# Patient Record
Sex: Male | Born: 1960 | Race: White | Hispanic: No | Marital: Married | Smoking: Never smoker
Health system: Southern US, Community
[De-identification: ages and names within clinical notes are randomized; demographics above are authoritative.]

## PROBLEM LIST (undated history)

## (undated) DIAGNOSIS — I10 Essential (primary) hypertension: Secondary | ICD-10-CM

## (undated) HISTORY — PX: KNEE ARTHROSCOPY: SUR90

---

## 1990-06-03 HISTORY — PX: OTHER SURGICAL HISTORY: SHX169

## 2006-03-07 ENCOUNTER — Ambulatory Visit: Payer: Self-pay | Admitting: Psychology

## 2006-03-10 ENCOUNTER — Ambulatory Visit: Payer: Self-pay | Admitting: Psychology

## 2006-03-12 ENCOUNTER — Ambulatory Visit: Payer: Self-pay | Admitting: Psychology

## 2006-03-28 ENCOUNTER — Ambulatory Visit: Payer: Self-pay | Admitting: Psychology

## 2006-04-08 ENCOUNTER — Ambulatory Visit: Payer: Self-pay | Admitting: Psychology

## 2006-06-19 ENCOUNTER — Ambulatory Visit: Payer: Self-pay | Admitting: Psychology

## 2006-07-08 ENCOUNTER — Ambulatory Visit: Payer: Self-pay | Admitting: Psychology

## 2006-07-29 ENCOUNTER — Ambulatory Visit: Payer: Self-pay | Admitting: Psychology

## 2006-08-05 ENCOUNTER — Ambulatory Visit: Payer: Self-pay | Admitting: Psychology

## 2006-08-19 ENCOUNTER — Ambulatory Visit: Payer: Self-pay | Admitting: Psychology

## 2006-09-30 ENCOUNTER — Ambulatory Visit: Payer: Self-pay | Admitting: Psychology

## 2010-01-03 ENCOUNTER — Ambulatory Visit: Payer: Self-pay | Admitting: Psychology

## 2010-03-28 ENCOUNTER — Ambulatory Visit: Payer: Self-pay | Admitting: Psychology

## 2010-04-12 ENCOUNTER — Ambulatory Visit: Payer: Self-pay | Admitting: Psychology

## 2010-10-15 ENCOUNTER — Ambulatory Visit (INDEPENDENT_AMBULATORY_CARE_PROVIDER_SITE_OTHER): Payer: BC Managed Care – PPO | Admitting: Psychology

## 2010-10-15 DIAGNOSIS — F4320 Adjustment disorder, unspecified: Secondary | ICD-10-CM

## 2010-10-24 ENCOUNTER — Ambulatory Visit (INDEPENDENT_AMBULATORY_CARE_PROVIDER_SITE_OTHER): Payer: BC Managed Care – PPO | Admitting: Psychology

## 2010-10-24 DIAGNOSIS — F4320 Adjustment disorder, unspecified: Secondary | ICD-10-CM

## 2010-11-07 ENCOUNTER — Ambulatory Visit (INDEPENDENT_AMBULATORY_CARE_PROVIDER_SITE_OTHER): Payer: BC Managed Care – PPO | Admitting: Psychology

## 2010-11-07 DIAGNOSIS — F4320 Adjustment disorder, unspecified: Secondary | ICD-10-CM

## 2010-11-08 ENCOUNTER — Ambulatory Visit: Payer: BC Managed Care – PPO | Admitting: Psychology

## 2010-11-22 ENCOUNTER — Ambulatory Visit (INDEPENDENT_AMBULATORY_CARE_PROVIDER_SITE_OTHER): Payer: BC Managed Care – PPO | Admitting: Psychology

## 2010-11-22 DIAGNOSIS — F4320 Adjustment disorder, unspecified: Secondary | ICD-10-CM

## 2010-12-18 ENCOUNTER — Ambulatory Visit (INDEPENDENT_AMBULATORY_CARE_PROVIDER_SITE_OTHER): Payer: BC Managed Care – PPO | Admitting: Psychology

## 2010-12-18 DIAGNOSIS — F4320 Adjustment disorder, unspecified: Secondary | ICD-10-CM

## 2011-02-11 ENCOUNTER — Ambulatory Visit: Payer: BC Managed Care – PPO | Admitting: Psychology

## 2011-02-21 ENCOUNTER — Ambulatory Visit (INDEPENDENT_AMBULATORY_CARE_PROVIDER_SITE_OTHER): Payer: BC Managed Care – PPO | Admitting: Licensed Clinical Social Worker

## 2011-02-21 DIAGNOSIS — F4320 Adjustment disorder, unspecified: Secondary | ICD-10-CM

## 2011-03-05 ENCOUNTER — Ambulatory Visit: Payer: BC Managed Care – PPO | Admitting: Psychology

## 2011-03-13 ENCOUNTER — Ambulatory Visit (INDEPENDENT_AMBULATORY_CARE_PROVIDER_SITE_OTHER): Payer: BC Managed Care – PPO | Admitting: Licensed Clinical Social Worker

## 2011-03-13 DIAGNOSIS — F4320 Adjustment disorder, unspecified: Secondary | ICD-10-CM

## 2011-03-28 ENCOUNTER — Ambulatory Visit (INDEPENDENT_AMBULATORY_CARE_PROVIDER_SITE_OTHER): Payer: BC Managed Care – PPO | Admitting: Licensed Clinical Social Worker

## 2011-03-28 DIAGNOSIS — F4320 Adjustment disorder, unspecified: Secondary | ICD-10-CM

## 2011-04-05 ENCOUNTER — Ambulatory Visit (INDEPENDENT_AMBULATORY_CARE_PROVIDER_SITE_OTHER): Payer: BC Managed Care – PPO | Admitting: Psychology

## 2011-04-05 DIAGNOSIS — F4320 Adjustment disorder, unspecified: Secondary | ICD-10-CM

## 2011-04-09 ENCOUNTER — Ambulatory Visit (INDEPENDENT_AMBULATORY_CARE_PROVIDER_SITE_OTHER): Payer: BC Managed Care – PPO | Admitting: Licensed Clinical Social Worker

## 2011-04-09 DIAGNOSIS — F4320 Adjustment disorder, unspecified: Secondary | ICD-10-CM

## 2011-04-19 ENCOUNTER — Ambulatory Visit (INDEPENDENT_AMBULATORY_CARE_PROVIDER_SITE_OTHER): Payer: BC Managed Care – PPO | Admitting: Licensed Clinical Social Worker

## 2011-04-19 DIAGNOSIS — F4320 Adjustment disorder, unspecified: Secondary | ICD-10-CM

## 2011-04-24 ENCOUNTER — Ambulatory Visit (INDEPENDENT_AMBULATORY_CARE_PROVIDER_SITE_OTHER): Payer: BC Managed Care – PPO | Admitting: Licensed Clinical Social Worker

## 2011-04-24 DIAGNOSIS — F4323 Adjustment disorder with mixed anxiety and depressed mood: Secondary | ICD-10-CM

## 2011-05-03 ENCOUNTER — Ambulatory Visit (INDEPENDENT_AMBULATORY_CARE_PROVIDER_SITE_OTHER): Payer: BC Managed Care – PPO | Admitting: Licensed Clinical Social Worker

## 2011-05-03 DIAGNOSIS — F4320 Adjustment disorder, unspecified: Secondary | ICD-10-CM

## 2011-05-10 ENCOUNTER — Ambulatory Visit (INDEPENDENT_AMBULATORY_CARE_PROVIDER_SITE_OTHER): Payer: BC Managed Care – PPO | Admitting: Licensed Clinical Social Worker

## 2011-05-10 DIAGNOSIS — F4320 Adjustment disorder, unspecified: Secondary | ICD-10-CM

## 2011-05-20 ENCOUNTER — Ambulatory Visit: Payer: BC Managed Care – PPO | Admitting: Licensed Clinical Social Worker

## 2011-05-21 ENCOUNTER — Ambulatory Visit (INDEPENDENT_AMBULATORY_CARE_PROVIDER_SITE_OTHER): Payer: BC Managed Care – PPO | Admitting: Licensed Clinical Social Worker

## 2011-05-21 DIAGNOSIS — F4320 Adjustment disorder, unspecified: Secondary | ICD-10-CM

## 2011-06-06 ENCOUNTER — Ambulatory Visit (INDEPENDENT_AMBULATORY_CARE_PROVIDER_SITE_OTHER): Payer: BC Managed Care – PPO | Admitting: Licensed Clinical Social Worker

## 2011-06-06 DIAGNOSIS — F4323 Adjustment disorder with mixed anxiety and depressed mood: Secondary | ICD-10-CM

## 2011-06-14 ENCOUNTER — Ambulatory Visit (INDEPENDENT_AMBULATORY_CARE_PROVIDER_SITE_OTHER): Payer: BC Managed Care – PPO | Admitting: Licensed Clinical Social Worker

## 2011-06-14 DIAGNOSIS — F4320 Adjustment disorder, unspecified: Secondary | ICD-10-CM

## 2011-06-21 ENCOUNTER — Ambulatory Visit: Payer: BC Managed Care – PPO | Admitting: Licensed Clinical Social Worker

## 2011-06-27 ENCOUNTER — Ambulatory Visit: Payer: BC Managed Care – PPO | Admitting: Licensed Clinical Social Worker

## 2011-07-02 ENCOUNTER — Ambulatory Visit (INDEPENDENT_AMBULATORY_CARE_PROVIDER_SITE_OTHER): Payer: BC Managed Care – PPO | Admitting: Licensed Clinical Social Worker

## 2011-07-02 DIAGNOSIS — F4320 Adjustment disorder, unspecified: Secondary | ICD-10-CM

## 2011-07-11 ENCOUNTER — Ambulatory Visit: Payer: BC Managed Care – PPO | Admitting: Licensed Clinical Social Worker

## 2011-07-25 ENCOUNTER — Ambulatory Visit (INDEPENDENT_AMBULATORY_CARE_PROVIDER_SITE_OTHER): Payer: BC Managed Care – PPO | Admitting: Licensed Clinical Social Worker

## 2011-07-25 DIAGNOSIS — F4320 Adjustment disorder, unspecified: Secondary | ICD-10-CM

## 2012-05-14 ENCOUNTER — Other Ambulatory Visit: Payer: Self-pay | Admitting: Orthopedic Surgery

## 2012-05-14 DIAGNOSIS — M25562 Pain in left knee: Secondary | ICD-10-CM

## 2012-05-18 ENCOUNTER — Ambulatory Visit
Admission: RE | Admit: 2012-05-18 | Discharge: 2012-05-18 | Disposition: A | Discharge status: Home / Self Care | Payer: BC Managed Care – PPO | Source: Ambulatory Visit | Attending: Orthopedic Surgery | Admitting: Orthopedic Surgery

## 2012-05-18 DIAGNOSIS — M25562 Pain in left knee: Secondary | ICD-10-CM

## 2014-04-13 ENCOUNTER — Other Ambulatory Visit: Payer: Self-pay | Admitting: Gastroenterology

## 2014-07-04 ENCOUNTER — Encounter (HOSPITAL_COMMUNITY): Payer: Self-pay | Admitting: *Deleted

## 2014-07-12 ENCOUNTER — Ambulatory Visit (HOSPITAL_COMMUNITY): Payer: BLUE CROSS/BLUE SHIELD | Admitting: Anesthesiology

## 2014-07-12 ENCOUNTER — Encounter (HOSPITAL_COMMUNITY): Payer: Self-pay | Admitting: Anesthesiology

## 2014-07-12 ENCOUNTER — Encounter (HOSPITAL_COMMUNITY)
Admission: RE | Disposition: A | Discharge status: Home / Self Care | Payer: Self-pay | Source: Ambulatory Visit | Attending: Gastroenterology

## 2014-07-12 ENCOUNTER — Ambulatory Visit (HOSPITAL_COMMUNITY)
Admission: RE | Admit: 2014-07-12 | Discharge: 2014-07-12 | Disposition: A | Discharge status: Home / Self Care | Payer: BLUE CROSS/BLUE SHIELD | Source: Ambulatory Visit | Attending: Gastroenterology | Admitting: Gastroenterology

## 2014-07-12 DIAGNOSIS — D120 Benign neoplasm of cecum: Secondary | ICD-10-CM | POA: Diagnosis not present

## 2014-07-12 DIAGNOSIS — Z1211 Encounter for screening for malignant neoplasm of colon: Secondary | ICD-10-CM | POA: Insufficient documentation

## 2014-07-12 DIAGNOSIS — I10 Essential (primary) hypertension: Secondary | ICD-10-CM | POA: Diagnosis not present

## 2014-07-12 HISTORY — DX: Essential (primary) hypertension: I10

## 2014-07-12 HISTORY — PX: COLONOSCOPY WITH PROPOFOL: SHX5780

## 2014-07-12 SURGERY — COLONOSCOPY WITH PROPOFOL
Anesthesia: Monitor Anesthesia Care

## 2014-07-12 MED ORDER — PROPOFOL 10 MG/ML IV BOLUS
INTRAVENOUS | Status: AC
  Filled 2014-07-12: qty 20

## 2014-07-12 MED ORDER — SODIUM CHLORIDE 0.9 % IV SOLN: INTRAVENOUS | Status: DC

## 2014-07-12 MED ORDER — LACTATED RINGERS IV SOLN
INTRAVENOUS | Status: DC | PRN
  Administered 2014-07-12: 10:00:00 via INTRAVENOUS

## 2014-07-12 MED ORDER — PROPOFOL 10 MG/ML IV BOLUS
INTRAVENOUS | Status: DC | PRN
  Administered 2014-07-12 (×3): 100 mg via INTRAVENOUS
  Administered 2014-07-12 (×2): 50 mg via INTRAVENOUS

## 2014-07-12 SURGICAL SUPPLY — 21 items

## 2014-07-12 NOTE — Anesthesia Postprocedure Evaluation (Signed)
  Anesthesia Post-op Note  Patient: Casey Davis  Procedure(s) Performed: Procedure(s) (LRB): COLONOSCOPY WITH PROPOFOL (N/A)  Patient Location: PACU  Anesthesia Type: MAC  Level of Consciousness: awake and alert   Airway and Oxygen Therapy: Patient Spontanous Breathing  Post-op Pain: mild  Post-op Assessment: Post-op Vital signs reviewed, Patient's Cardiovascular Status Stable, Respiratory Function Stable, Patent Airway and No signs of Nausea or vomiting  Last Vitals:  Filed Vitals:   07/12/14 1040  BP: 177/97  Pulse: 62  Temp:   Resp: 14    Post-op Vital Signs: stable   Complications: No apparent anesthesia complications

## 2014-07-12 NOTE — Op Note (Signed)
Procedure: Baseline screening colonoscopy  Endoscopist: Earle Gell  Premedication: Propofol administered by anesthesia  Procedure: The patient was placed in the left lateral decubitus position. Anal inspection and digital rectal exam were normal. The Pentax pediatric colonoscope was introduced into the rectum and advanced to the cecum. A normal-appearing appendiceal orifice and ileocecal valve were identified. Colonic preparation for the exam today was good. Withdrawal time was 11 minutes  Rectum. Normal. Retroflexed view of the distal rectum normal  Sigmoid colon and descending colon. Normal  Splenic flexure. Normal  Transverse colon. Normal  Hepatic flexure. Normal  Ascending colon. Normal  Cecum and ileocecal valve. From the distal cecum, a 3 mm sessile polyp was removed with the cold biopsy forceps  Assessment: A 3 mm sized polyp was removed from the distal cecum; otherwise normal colonoscopy  Recommendation: If the cecal polyp returns adenomatous pathologically, the patient should undergo a surveillance colonoscopy in 5 years. If the polyp returns nonneoplastic pathologically, he should undergo a repeat screening colonoscopy in 10 years

## 2014-07-12 NOTE — H&P (Signed)
  Procedure: Baseline screening colonoscopy  History: The patient is a 54 year old male born Apr 21, 1961. He is scheduled to undergo his first screening colonoscopy with polypectomy to prevent colon cancer.  Medication allergies: Penicillin  Past medical history: Lumbar laminectomy. Right knee arthroscopy. Hypertension.  Exam: The patient is alert and lying comfortably on the endoscopy stretcher. Abdomen is soft and nontender to palpation. Lungs are clear to auscultation. Cardiac exam reveals a regular rhythm.  Plan: Proceed with baseline screening colonoscopy

## 2014-07-12 NOTE — Anesthesia Preprocedure Evaluation (Signed)
Anesthesia Evaluation  Patient identified by MRN, date of birth, ID band Patient awake    Reviewed: Allergy & Precautions, NPO status , Patient's Chart, lab work & pertinent test results  Airway Mallampati: II  TM Distance: >3 FB Neck ROM: Full    Dental no notable dental hx.    Pulmonary neg pulmonary ROS,  breath sounds clear to auscultation  Pulmonary exam normal       Cardiovascular Exercise Tolerance: Good hypertension, Pt. on medications Rhythm:Regular Rate:Normal     Neuro/Psych negative neurological ROS  negative psych ROS   GI/Hepatic negative GI ROS, Neg liver ROS,   Endo/Other  negative endocrine ROS  Renal/GU negative Renal ROS  negative genitourinary   Musculoskeletal negative musculoskeletal ROS (+)   Abdominal   Peds negative pediatric ROS (+)  Hematology negative hematology ROS (+)   Anesthesia Other Findings   Reproductive/Obstetrics negative OB ROS                             Anesthesia Physical Anesthesia Plan  ASA: II  Anesthesia Plan: MAC   Post-op Pain Management:    Induction: Intravenous  Airway Management Planned:   Additional Equipment:   Intra-op Plan:   Post-operative Plan:   Informed Consent: I have reviewed the patients History and Physical, chart, labs and discussed the procedure including the risks, benefits and alternatives for the proposed anesthesia with the patient or authorized representative who has indicated his/her understanding and acceptance.   Dental advisory given  Plan Discussed with: CRNA  Anesthesia Plan Comments:         Anesthesia Quick Evaluation

## 2014-07-12 NOTE — Transfer of Care (Signed)
Immediate Anesthesia Transfer of Care Note  Patient: Casey Davis  Procedure(s) Performed: Procedure(s) (LRB): COLONOSCOPY WITH PROPOFOL (N/A)  Patient Location: PACU  Anesthesia Type: MAC  Level of Consciousness: sedated, patient cooperative and responds to stimulation  Airway & Oxygen Therapy: Patient Spontanous Breathing and Patient connected to face mask oxgen  Post-op Assessment: Report given to PACU RN and Post -op Vital signs reviewed and stable  Post vital signs: Reviewed and stable  Complications: No apparent anesthesia complications

## 2014-07-12 NOTE — Discharge Instructions (Signed)

## 2014-07-13 ENCOUNTER — Encounter (HOSPITAL_COMMUNITY): Payer: Self-pay | Admitting: Gastroenterology

## 2016-05-08 ENCOUNTER — Ambulatory Visit (INDEPENDENT_AMBULATORY_CARE_PROVIDER_SITE_OTHER): Payer: BLUE CROSS/BLUE SHIELD | Admitting: Orthopedic Surgery

## 2016-05-08 ENCOUNTER — Encounter (INDEPENDENT_AMBULATORY_CARE_PROVIDER_SITE_OTHER): Payer: Self-pay | Admitting: Orthopedic Surgery

## 2016-05-08 ENCOUNTER — Ambulatory Visit (INDEPENDENT_AMBULATORY_CARE_PROVIDER_SITE_OTHER): Payer: BLUE CROSS/BLUE SHIELD

## 2016-05-08 DIAGNOSIS — M25561 Pain in right knee: Principal | ICD-10-CM

## 2016-05-08 DIAGNOSIS — G8929 Other chronic pain: Secondary | ICD-10-CM

## 2016-05-09 DIAGNOSIS — M25561 Pain in right knee: Secondary | ICD-10-CM

## 2016-05-09 DIAGNOSIS — G8929 Other chronic pain: Secondary | ICD-10-CM

## 2016-05-09 MED ORDER — BUPIVACAINE HCL 0.25 % IJ SOLN
4.0000 mL | INTRAMUSCULAR | Status: AC | PRN
  Administered 2016-05-09: 4 mL via INTRA_ARTICULAR

## 2016-05-09 MED ORDER — METHYLPREDNISOLONE ACETATE 40 MG/ML IJ SUSP
40.0000 mg | INTRAMUSCULAR | Status: AC | PRN
  Administered 2016-05-09: 40 mg via INTRA_ARTICULAR

## 2016-05-09 MED ORDER — LIDOCAINE HCL 1 % IJ SOLN
5.0000 mL | INTRAMUSCULAR | Status: AC | PRN
  Administered 2016-05-09: 5 mL

## 2016-05-09 NOTE — Progress Notes (Signed)
Office Visit Note   Patient: Casey Davis           Date of Birth: 31-Jul-1960           MRN: [id number] Visit Date: 05/08/2016 Requested by: Marius Ditch, MD [phone] N. [address] [place], [place] [address] PCP: Carlena Sax, MD  Subjective: Chief Complaint  Patient presents with  . Right Knee - Pain    HPI Casey Davis is a 55 year old patient with four-week history of right knee pain.  Had surgery 02/22/2013 which was for loose body removal and partial medial lateral meniscectomies.  He wears his brace some days.  Today is a better day for him than most.  He states that it feels like it's unstable at times with hyperextension.  Sitting with his knee bent increases the pain.  Since of last seen him he has had inflammatory pain which is affecting all joints of his body resolve.  He saw a rheumatologist 2 without much explanation of his symptoms.  His symptoms did resolve on their own.  It is hard for him to stand.  He denies any swelling or mechanical symptoms.              Review of Systems All systems reviewed are negative as they relate to the chief complaint within the history of present illness.  Patient denies  fevers or chills.    Assessment & Plan: Visit Diagnoses:  1. Chronic pain of right knee     Plan:Impression is right knee pain with likely progression of osteoarthritis.  Radiographs today do show what appears to be loss of joint space laterally.  Aspiration injection performed today.  I will pre-approve him for Euflexxa.  Also we'll start him in some physical therapy.  He'll come back in for Euflexxa injections once this cortisone shot wears off.  Follow-Up Instructions: No Follow-up on file.   Orders:  Orders Placed This Encounter  Procedures  . XR KNEE 3 VIEW RIGHT   No orders of the defined types were placed in this encounter.     Procedures: Large Joint Inj Date/Time: 05/09/2016 7:17 PM Performed by: Meredith Pel Authorized by: Meredith Pel   Consent Given by:  Patient Site marked: the procedure site was marked   Timeout: prior to procedure the correct patient, procedure, and site was verified   Indications:  Pain, joint swelling and diagnostic evaluation Location:  Knee Site:  R knee Prep: patient was prepped and draped in usual sterile fashion   Needle Size:  18 G Needle Length:  1.5 inches Approach:  Superolateral Ultrasound Guidance: No   Fluoroscopic Guidance: No   Arthrogram: No   Medications:  5 mL lidocaine 1 %; 4 mL bupivacaine 0.25 %; 40 mg methylPREDNISolone acetate 40 MG/ML Aspiration Attempted: Yes   Aspirate amount (mL):  10 Patient tolerance:  Patient tolerated the procedure well with no immediate complications     Clinical Data: No additional findings.  Objective: Vital Signs: There were no vitals taken for this visit.  Physical Exam   Constitutional: Patient appears well-developed HEENT:  Head: Normocephalic Eyes:EOM are normal Neck: Normal range of motion Cardiovascular: Normal rate Pulmonary/chest: Effort normal Neurologic: Patient is alert Skin: Skin is warm Psychiatric: Patient has normal mood and affect    Ortho Exam examination the right knee demonstrates mild effusion excellent range of motion stable collateral cruciate ligaments he does lack about 10 of full flexion on the right compared to the left.  Quad strength is excellent bilaterally.  Negative patellar apprehension.  Medial and lateral joint line tenderness is present.  No other masses lymph adenopathy or skin changes noted in the right knee region.  No groin pain with internal/external rotation of the leg.  Specialty Comments:  No specialty comments available.  Imaging: No results found.   PMFS History: There are no active problems to display for this patient.  Past Medical History:  Diagnosis Date  . Hypertension     No family history on file.  Past Surgical History:  Procedure Laterality Date  .  COLONOSCOPY WITH PROPOFOL N/A 07/12/2014   Procedure: COLONOSCOPY WITH PROPOFOL;  Surgeon: Garlan Fair, MD;  Location: [facility];  Service: Endoscopy;  Laterality: N/A;  . KNEE ARTHROSCOPY  2005 x 2 right, 2013 left, 2014 right  . lunbar back surgery  1992   Social History   Occupational History  . Not on file.   Social History Main Topics  . Smoking status: Never Smoker  . Smokeless tobacco: Never Used  . Alcohol use Yes     Comment: occasional  . Drug use: No  . Sexual activity: Not on file

## 2016-05-23 ENCOUNTER — Encounter (INDEPENDENT_AMBULATORY_CARE_PROVIDER_SITE_OTHER): Payer: Self-pay | Admitting: Radiology

## 2016-05-24 NOTE — Progress Notes (Signed)
Faxed to Euflexxa for VOB

## 2017-02-24 ENCOUNTER — Other Ambulatory Visit: Payer: Self-pay

## 2017-04-22 ENCOUNTER — Other Ambulatory Visit: Payer: Self-pay | Admitting: Internal Medicine

## 2017-04-22 ENCOUNTER — Ambulatory Visit
Admission: RE | Admit: 2017-04-22 | Discharge: 2017-04-22 | Disposition: A | Discharge status: Home / Self Care | Payer: BLUE CROSS/BLUE SHIELD | Source: Ambulatory Visit | Attending: Internal Medicine | Admitting: Internal Medicine

## 2017-04-22 DIAGNOSIS — R1084 Generalized abdominal pain: Secondary | ICD-10-CM

## 2017-04-22 DIAGNOSIS — R109 Unspecified abdominal pain: Secondary | ICD-10-CM

## 2017-04-22 MED ORDER — IOPAMIDOL (ISOVUE-300) INJECTION 61%
75.0000 mL | Freq: Once | INTRAVENOUS | Status: AC | PRN
Start: 2017-04-22 — End: 2017-04-22
  Administered 2017-04-22: 100 mL via INTRAVENOUS

## 2019-02-25 ENCOUNTER — Other Ambulatory Visit: Payer: Self-pay

## 2020-05-02 DIAGNOSIS — R319 Hematuria, unspecified: Secondary | ICD-10-CM | POA: Diagnosis not present

## 2020-05-02 DIAGNOSIS — R300 Dysuria: Secondary | ICD-10-CM | POA: Diagnosis not present

## 2020-05-08 DIAGNOSIS — R300 Dysuria: Secondary | ICD-10-CM | POA: Diagnosis not present

## 2020-05-08 DIAGNOSIS — N201 Calculus of ureter: Secondary | ICD-10-CM | POA: Diagnosis not present

## 2020-05-18 DIAGNOSIS — Z23 Encounter for immunization: Secondary | ICD-10-CM | POA: Diagnosis not present

## 2020-06-07 DIAGNOSIS — N201 Calculus of ureter: Secondary | ICD-10-CM | POA: Diagnosis not present

## 2020-08-09 DIAGNOSIS — Z125 Encounter for screening for malignant neoplasm of prostate: Secondary | ICD-10-CM | POA: Diagnosis not present

## 2020-08-09 DIAGNOSIS — I10 Essential (primary) hypertension: Secondary | ICD-10-CM | POA: Diagnosis not present

## 2020-08-09 DIAGNOSIS — E785 Hyperlipidemia, unspecified: Secondary | ICD-10-CM | POA: Diagnosis not present

## 2020-08-09 DIAGNOSIS — Z658 Other specified problems related to psychosocial circumstances: Secondary | ICD-10-CM | POA: Diagnosis not present

## 2020-08-09 DIAGNOSIS — R739 Hyperglycemia, unspecified: Secondary | ICD-10-CM | POA: Diagnosis not present

## 2020-08-09 DIAGNOSIS — Z0000 Encounter for general adult medical examination without abnormal findings: Secondary | ICD-10-CM | POA: Diagnosis not present

## 2020-08-09 DIAGNOSIS — R6882 Decreased libido: Secondary | ICD-10-CM | POA: Diagnosis not present

## 2020-09-04 DIAGNOSIS — E669 Obesity, unspecified: Secondary | ICD-10-CM | POA: Diagnosis not present

## 2020-09-04 DIAGNOSIS — I10 Essential (primary) hypertension: Secondary | ICD-10-CM | POA: Diagnosis not present

## 2020-09-04 DIAGNOSIS — E785 Hyperlipidemia, unspecified: Secondary | ICD-10-CM | POA: Diagnosis not present

## 2020-10-02 ENCOUNTER — Other Ambulatory Visit: Payer: Self-pay

## 2020-10-02 ENCOUNTER — Encounter: Payer: Self-pay | Admitting: Dermatology

## 2020-10-02 ENCOUNTER — Ambulatory Visit (INDEPENDENT_AMBULATORY_CARE_PROVIDER_SITE_OTHER): Payer: BC Managed Care – PPO | Admitting: Dermatology

## 2020-10-02 DIAGNOSIS — Z1283 Encounter for screening for malignant neoplasm of skin: Secondary | ICD-10-CM | POA: Diagnosis not present

## 2020-10-02 DIAGNOSIS — L300 Nummular dermatitis: Secondary | ICD-10-CM | POA: Diagnosis not present

## 2020-10-02 DIAGNOSIS — L821 Other seborrheic keratosis: Secondary | ICD-10-CM | POA: Diagnosis not present

## 2020-10-02 MED ORDER — CLOBETASOL PROP EMOLLIENT BASE 0.05 % EX CREA: 1.0000 "application " | TOPICAL_CREAM | Freq: Two times a day (BID) | CUTANEOUS | 4 refills | Status: AC

## 2020-10-10 ENCOUNTER — Encounter: Payer: Self-pay | Admitting: Dermatology

## 2020-10-10 NOTE — Progress Notes (Signed)
   Follow-Up Visit   Subjective  Casey Davis is a 59 y.o. male who presents for the following: Annual Exam (New lesion on left calf x 1 month- + itch no bleed tx- lotion).  Scaly spot left leg for past month Location:  Duration:  Quality:  Associated Signs/Symptoms: Modifying Factors:  Severity:  Timing: Context:   Objective  Well appearing patient in no apparent distress; mood and affect are within normal limits. Objective  Left Breast: General skin examination, no atypical pigmented lesions.  Objective  Left Posterior Neck, Right Forearm - Posterior: 5 mm textured flattopped brown papules  Objective  Left Knee - Anterior: Examine is 1.5 cm spot, nummular dermatitis (versus CIS).    A full examination was performed including scalp, head, eyes, ears, nose, lips, neck, chest, axillae, abdomen, back, buttocks, bilateral upper extremities, bilateral lower extremities, hands, feet, fingers, toes, fingernails, and toenails. All findings within normal limits unless otherwise noted below.  Areas beneath undergarments not fully examined.   Assessment & Plan    Encounter for screening for malignant neoplasm of skin Left Breast  Annual skin examination, encouraged to self examine twice annually.  Encouraged continued ultraviolet protection.  Seborrheic keratosis (2) Right Forearm - Posterior; Left Posterior Neck  Leave if stable  Nummular eczema Left Knee - Anterior  Daily application of clobetasol cream after bathing for 2 to 4weeks.  If no response, return for biopsy to rule out superficial carcinoma.  Clobetasol Prop Emollient Base (CLOBETASOL PROPIONATE E) 0.05 % emollient cream - Left Knee - Anterior      I, Lavonna Monarch, MD, have reviewed all documentation for this visit.  The documentation on 10/10/20 for the exam, diagnosis, procedures, and orders are all accurate and complete.

## 2020-12-07 DIAGNOSIS — E785 Hyperlipidemia, unspecified: Secondary | ICD-10-CM | POA: Diagnosis not present

## 2020-12-07 DIAGNOSIS — E669 Obesity, unspecified: Secondary | ICD-10-CM | POA: Diagnosis not present

## 2020-12-07 DIAGNOSIS — I10 Essential (primary) hypertension: Secondary | ICD-10-CM | POA: Diagnosis not present

## 2021-07-05 DIAGNOSIS — Z86010 Personal history of colonic polyps: Secondary | ICD-10-CM | POA: Diagnosis not present

## 2021-07-05 DIAGNOSIS — D124 Benign neoplasm of descending colon: Secondary | ICD-10-CM | POA: Diagnosis not present

## 2021-07-05 DIAGNOSIS — K5730 Diverticulosis of large intestine without perforation or abscess without bleeding: Secondary | ICD-10-CM | POA: Diagnosis not present

## 2021-08-16 ENCOUNTER — Other Ambulatory Visit: Payer: Self-pay | Admitting: Internal Medicine

## 2021-08-16 DIAGNOSIS — E559 Vitamin D deficiency, unspecified: Secondary | ICD-10-CM | POA: Diagnosis not present

## 2021-08-16 DIAGNOSIS — Z0001 Encounter for general adult medical examination with abnormal findings: Secondary | ICD-10-CM | POA: Diagnosis not present

## 2021-08-16 DIAGNOSIS — E785 Hyperlipidemia, unspecified: Secondary | ICD-10-CM | POA: Diagnosis not present

## 2021-08-16 DIAGNOSIS — I10 Essential (primary) hypertension: Secondary | ICD-10-CM | POA: Diagnosis not present

## 2021-08-16 DIAGNOSIS — Z125 Encounter for screening for malignant neoplasm of prostate: Secondary | ICD-10-CM | POA: Diagnosis not present

## 2021-10-03 ENCOUNTER — Ambulatory Visit
Admission: RE | Admit: 2021-10-03 | Discharge: 2021-10-03 | Disposition: A | Discharge status: Home / Self Care | Payer: No Typology Code available for payment source | Source: Ambulatory Visit | Attending: Internal Medicine | Admitting: Internal Medicine

## 2021-10-03 DIAGNOSIS — E785 Hyperlipidemia, unspecified: Secondary | ICD-10-CM

## 2021-10-03 DIAGNOSIS — Z8249 Family history of ischemic heart disease and other diseases of the circulatory system: Secondary | ICD-10-CM

## 2021-10-08 DIAGNOSIS — Z8249 Family history of ischemic heart disease and other diseases of the circulatory system: Secondary | ICD-10-CM | POA: Diagnosis not present

## 2021-10-08 DIAGNOSIS — I10 Essential (primary) hypertension: Secondary | ICD-10-CM | POA: Diagnosis not present

## 2021-10-08 DIAGNOSIS — E785 Hyperlipidemia, unspecified: Secondary | ICD-10-CM | POA: Diagnosis not present

## 2021-10-15 DIAGNOSIS — H524 Presbyopia: Secondary | ICD-10-CM | POA: Diagnosis not present

## 2021-10-15 DIAGNOSIS — H43813 Vitreous degeneration, bilateral: Secondary | ICD-10-CM | POA: Diagnosis not present

## 2022-01-28 DIAGNOSIS — I10 Essential (primary) hypertension: Secondary | ICD-10-CM | POA: Diagnosis not present

## 2022-01-28 DIAGNOSIS — E785 Hyperlipidemia, unspecified: Secondary | ICD-10-CM | POA: Diagnosis not present

## 2022-01-28 DIAGNOSIS — Z8249 Family history of ischemic heart disease and other diseases of the circulatory system: Secondary | ICD-10-CM | POA: Diagnosis not present

## 2022-03-28 ENCOUNTER — Ambulatory Visit (INDEPENDENT_AMBULATORY_CARE_PROVIDER_SITE_OTHER): Payer: BC Managed Care – PPO | Admitting: Sports Medicine

## 2022-03-28 DIAGNOSIS — M7741 Metatarsalgia, right foot: Secondary | ICD-10-CM

## 2022-03-28 DIAGNOSIS — M7742 Metatarsalgia, left foot: Secondary | ICD-10-CM | POA: Diagnosis not present

## 2022-03-28 NOTE — Progress Notes (Signed)
   Subjective:    Patient ID: Casey Davis, male    DOB: [birth year], 61 y.o.   MRN: [id number]  HPI chief complaint: Left foot pain  Patient is a very pleasant 61 year old male that presents today with approximately 6 months of intermittent left foot pain.  Pain is primarily localized to the arch of the foot as well as at the plantar aspect of the forefoot.  His pain was quite severe a few days ago.  He has been self treating with some massage and Theragran use.  This has improved his symptoms but they have not resolved.  He has a mild amount of discomfort in the right forefoot as well.  He has self diagnosed Planter fasciitis.  He does have some over-the-counter inserts that he wears in his shoes.  He lives in [place] and has a boat that he enjoys taking out on the water.  He knows that flip-flops or not the best shoe wear but he has found a pair that have really good arch support and are comfortable.  He denies a previous history of Planter fasciitis in either foot.  Past medical history reviewed Medications reviewed Allergies reviewed   Review of Systems As above    Objective:   Physical Exam  Well-developed, well-nourished.  No acute distress  Examination of his feet in standing position shows a fairly well-preserved longitudinal arch bilaterally.  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  He is tender to palpation at the second metatarsal head on the plantar aspect of the right foot.  He is also tender to palpation at the second metatarsal head on the left foot.  Slight callus formation on the plantar forefoot.  No swelling on the dorsum of the foot.  No tenderness to palpation at the calcaneal origin of the plantar fascia.  Good pulses.      Assessment & Plan:   Bilateral foot pain secondary to metatarsalgia  I believe the patient's transverse arch collapse is contributing to bilateral metatarsalgia.  We will try a simple metatarsal pad on a green  sports insole for both feet.  If he finds this to be comfortable, he may order additional pads from [facility] directly.  He may also continue with his Theragran use and massage since this does seem to be helpful.  We will follow-up with me for ongoing or recalcitrant issues new.  This note was dictated using Dragon naturally speaking software and may contain errors in syntax, spelling, or content which have not been identified prior to signing this note.

## 2022-08-26 DIAGNOSIS — D485 Neoplasm of uncertain behavior of skin: Secondary | ICD-10-CM | POA: Diagnosis not present

## 2022-08-26 DIAGNOSIS — L570 Actinic keratosis: Secondary | ICD-10-CM | POA: Diagnosis not present

## 2022-08-26 DIAGNOSIS — C4361 Malignant melanoma of right upper limb, including shoulder: Secondary | ICD-10-CM | POA: Diagnosis not present

## 2022-08-26 DIAGNOSIS — B079 Viral wart, unspecified: Secondary | ICD-10-CM | POA: Diagnosis not present

## 2022-08-27 DIAGNOSIS — C4361 Malignant melanoma of right upper limb, including shoulder: Secondary | ICD-10-CM | POA: Diagnosis not present

## 2022-09-06 DIAGNOSIS — I6381 Other cerebral infarction due to occlusion or stenosis of small artery: Secondary | ICD-10-CM | POA: Diagnosis not present

## 2022-09-06 DIAGNOSIS — R29898 Other symptoms and signs involving the musculoskeletal system: Secondary | ICD-10-CM | POA: Diagnosis not present

## 2022-09-06 DIAGNOSIS — Z880 Allergy status to penicillin: Secondary | ICD-10-CM | POA: Diagnosis not present

## 2022-09-06 DIAGNOSIS — Z8249 Family history of ischemic heart disease and other diseases of the circulatory system: Secondary | ICD-10-CM | POA: Diagnosis not present

## 2022-09-06 DIAGNOSIS — R200 Anesthesia of skin: Secondary | ICD-10-CM | POA: Diagnosis not present

## 2022-09-06 DIAGNOSIS — I6521 Occlusion and stenosis of right carotid artery: Secondary | ICD-10-CM | POA: Diagnosis not present

## 2022-09-06 DIAGNOSIS — I10 Essential (primary) hypertension: Secondary | ICD-10-CM | POA: Diagnosis not present

## 2022-09-06 DIAGNOSIS — Z87442 Personal history of urinary calculi: Secondary | ICD-10-CM | POA: Diagnosis not present

## 2022-09-06 DIAGNOSIS — Z791 Long term (current) use of non-steroidal anti-inflammatories (NSAID): Secondary | ICD-10-CM | POA: Diagnosis not present

## 2022-09-06 DIAGNOSIS — G8191 Hemiplegia, unspecified affecting right dominant side: Secondary | ICD-10-CM | POA: Diagnosis not present

## 2022-09-06 DIAGNOSIS — I639 Cerebral infarction, unspecified: Secondary | ICD-10-CM | POA: Diagnosis not present

## 2022-09-06 DIAGNOSIS — R29700 NIHSS score 0: Secondary | ICD-10-CM | POA: Diagnosis not present

## 2022-09-06 DIAGNOSIS — G459 Transient cerebral ischemic attack, unspecified: Secondary | ICD-10-CM | POA: Diagnosis not present

## 2022-09-06 DIAGNOSIS — I6523 Occlusion and stenosis of bilateral carotid arteries: Secondary | ICD-10-CM | POA: Diagnosis not present

## 2022-09-06 DIAGNOSIS — Z79899 Other long term (current) drug therapy: Secondary | ICD-10-CM | POA: Diagnosis not present

## 2022-09-06 DIAGNOSIS — D0361 Melanoma in situ of right upper limb, including shoulder: Secondary | ICD-10-CM | POA: Diagnosis not present

## 2022-09-06 DIAGNOSIS — R531 Weakness: Secondary | ICD-10-CM | POA: Diagnosis not present

## 2022-09-06 DIAGNOSIS — E785 Hyperlipidemia, unspecified: Secondary | ICD-10-CM | POA: Diagnosis not present

## 2022-09-10 DIAGNOSIS — I10 Essential (primary) hypertension: Secondary | ICD-10-CM | POA: Diagnosis not present

## 2022-09-10 DIAGNOSIS — I6381 Other cerebral infarction due to occlusion or stenosis of small artery: Secondary | ICD-10-CM | POA: Diagnosis not present

## 2022-09-24 DIAGNOSIS — I10 Essential (primary) hypertension: Secondary | ICD-10-CM | POA: Diagnosis not present

## 2022-10-09 DIAGNOSIS — E878 Other disorders of electrolyte and fluid balance, not elsewhere classified: Secondary | ICD-10-CM | POA: Diagnosis not present

## 2022-10-11 DIAGNOSIS — L988 Other specified disorders of the skin and subcutaneous tissue: Secondary | ICD-10-CM | POA: Diagnosis not present

## 2022-10-11 DIAGNOSIS — C4361 Malignant melanoma of right upper limb, including shoulder: Secondary | ICD-10-CM | POA: Diagnosis not present

## 2022-10-11 DIAGNOSIS — D0361 Melanoma in situ of right upper limb, including shoulder: Secondary | ICD-10-CM | POA: Diagnosis not present

## 2022-10-18 DIAGNOSIS — Z1330 Encounter for screening examination for mental health and behavioral disorders, unspecified: Secondary | ICD-10-CM | POA: Diagnosis not present

## 2022-10-18 DIAGNOSIS — Z8673 Personal history of transient ischemic attack (TIA), and cerebral infarction without residual deficits: Secondary | ICD-10-CM | POA: Diagnosis not present

## 2022-10-18 DIAGNOSIS — I10 Essential (primary) hypertension: Secondary | ICD-10-CM | POA: Diagnosis not present

## 2022-10-18 DIAGNOSIS — I6381 Other cerebral infarction due to occlusion or stenosis of small artery: Secondary | ICD-10-CM | POA: Diagnosis not present

## 2022-10-18 DIAGNOSIS — G464 Cerebellar stroke syndrome: Secondary | ICD-10-CM | POA: Diagnosis not present

## 2022-10-22 DIAGNOSIS — C4361 Malignant melanoma of right upper limb, including shoulder: Secondary | ICD-10-CM | POA: Diagnosis not present

## 2022-10-22 DIAGNOSIS — B079 Viral wart, unspecified: Secondary | ICD-10-CM | POA: Diagnosis not present

## 2022-10-22 DIAGNOSIS — L570 Actinic keratosis: Secondary | ICD-10-CM | POA: Diagnosis not present

## 2022-11-18 DIAGNOSIS — I10 Essential (primary) hypertension: Secondary | ICD-10-CM | POA: Diagnosis not present

## 2022-11-18 DIAGNOSIS — G464 Cerebellar stroke syndrome: Secondary | ICD-10-CM | POA: Diagnosis not present

## 2022-12-02 DIAGNOSIS — B079 Viral wart, unspecified: Secondary | ICD-10-CM | POA: Diagnosis not present

## 2022-12-02 DIAGNOSIS — L821 Other seborrheic keratosis: Secondary | ICD-10-CM | POA: Diagnosis not present

## 2022-12-02 DIAGNOSIS — C4361 Malignant melanoma of right upper limb, including shoulder: Secondary | ICD-10-CM | POA: Diagnosis not present

## 2022-12-02 DIAGNOSIS — L570 Actinic keratosis: Secondary | ICD-10-CM | POA: Diagnosis not present

## 2023-03-01 IMAGING — CT CT CARDIAC CORONARY ARTERY CALCIUM SCORE
3 series · 14 of 20 positions shown, 16 images · non-contrast
Comparison: No priors.

CLINICAL DATA: 61-year-old Caucasian male with history of
hyperlipidemia. Evaluate for coronary artery disease.

EXAM:
CT CARDIAC CORONARY ARTERY CALCIUM SCORE
TECHNIQUE: Non-contrast imaging through the heart was performed using
prospective ECG gating. Image post processing was performed on an
independent workstation, allowing for quantitative analysis of the
heart and coronary arteries. Note that this exam targets the heart
and the chest was not imaged in its entirety.

[Series 2: calcium scoring 2.00 qr36 bestdiast 70% hrt calciu · axial · 0.41mm/px · z∈[+1679,+1775]mm · 4 of 80 slices shown]
[im 16/80  vessel]
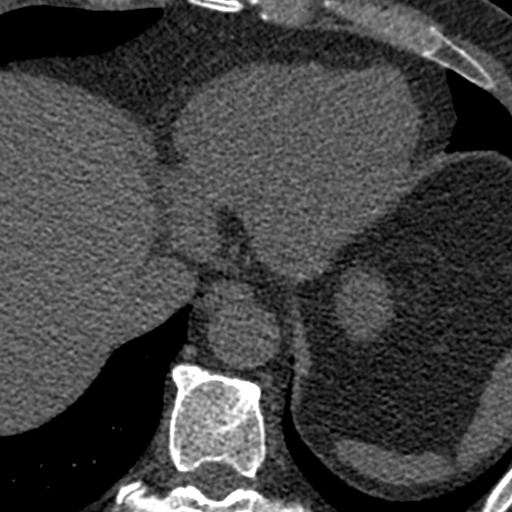
[im 32/80  vessel]
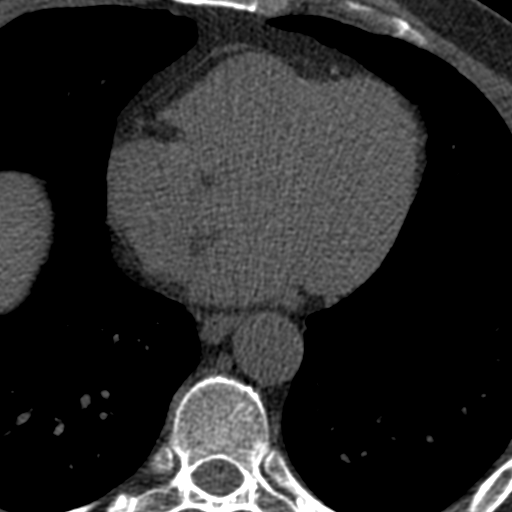
[im 48/80  vessel]
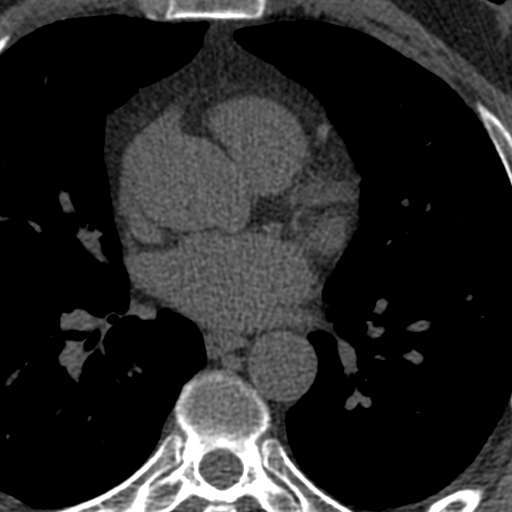
[im 64/80  vessel]
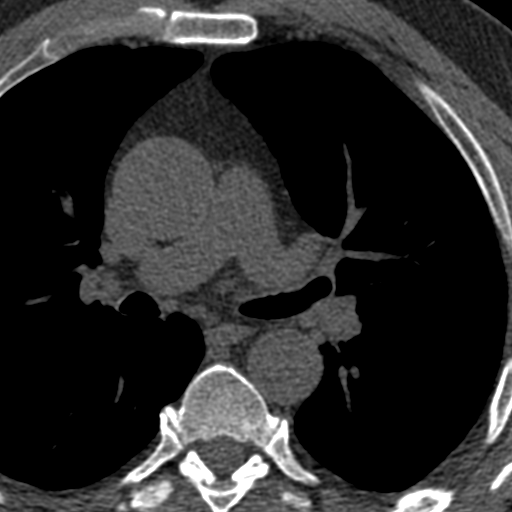

[Series 3: calcium scoring 2.00 br40 bestdiast 70% axial · axial · 0.67mm/px · z∈[+1675,+1779]mm · 5 of 80 slices shown, 7 images]
[im 14/80  vessel]
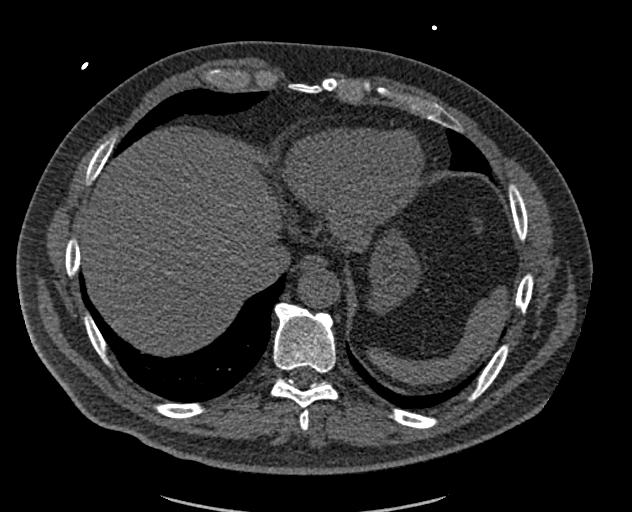
[im 14/80  lung]
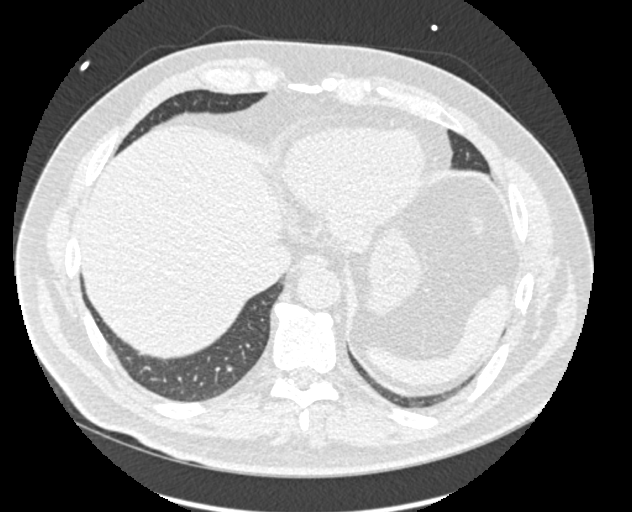
[im 27/80  vessel]
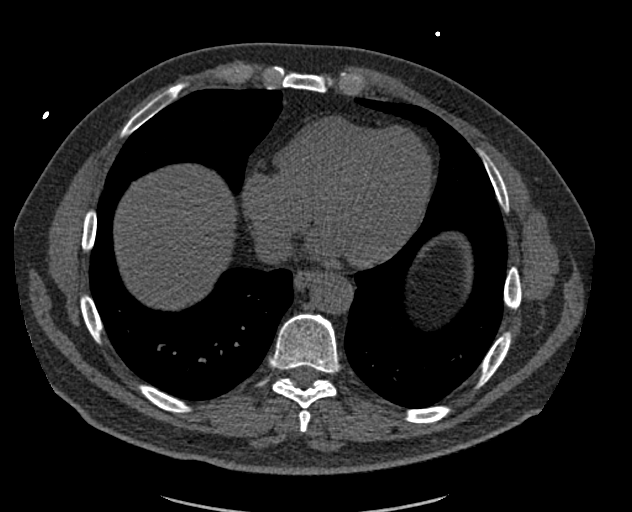
[im 40/80  vessel]
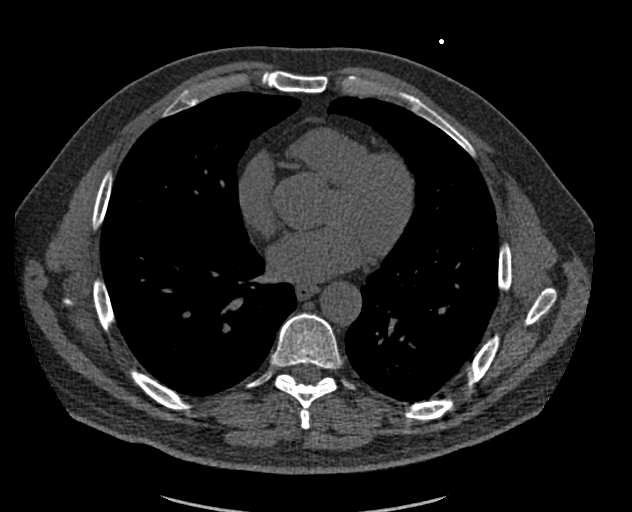
[im 53/80  vessel]
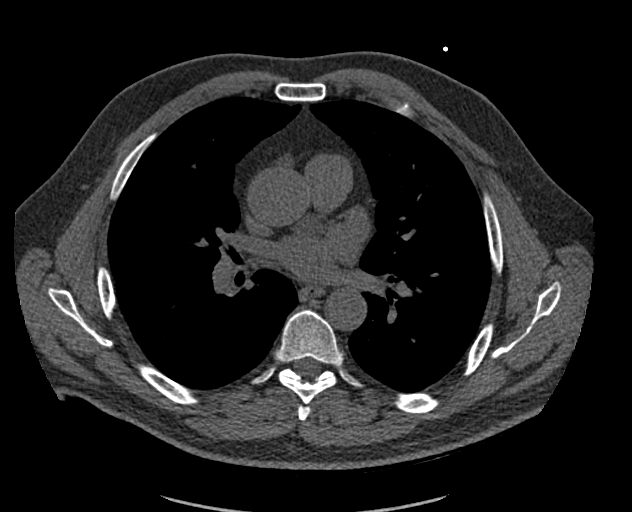
[im 66/80  vessel]
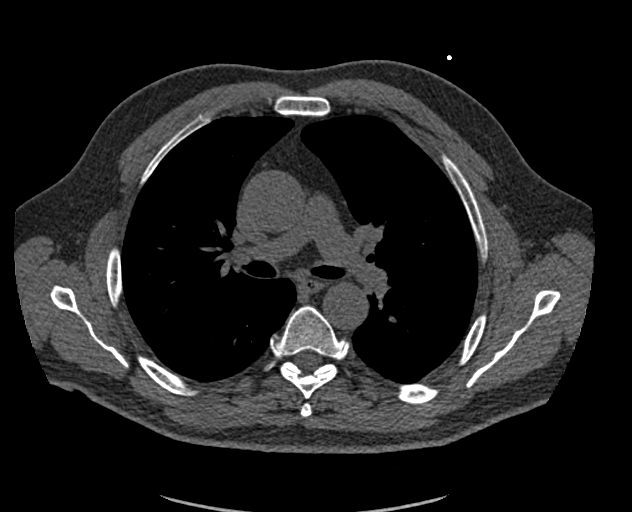
[im 66/80  lung]
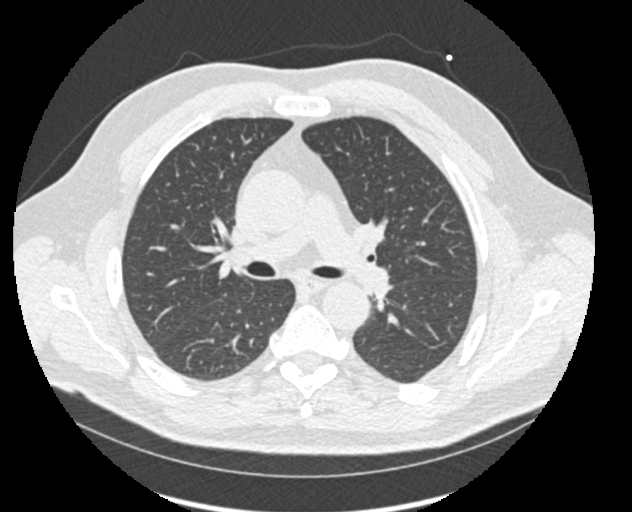

[Series 9: calcium scoring 2.00 br60 bestdiast 70% lungs · axial · 0.67mm/px · z∈[+1675,+1779]mm · 5 of 80 slices shown]
[im 14/80  vessel]
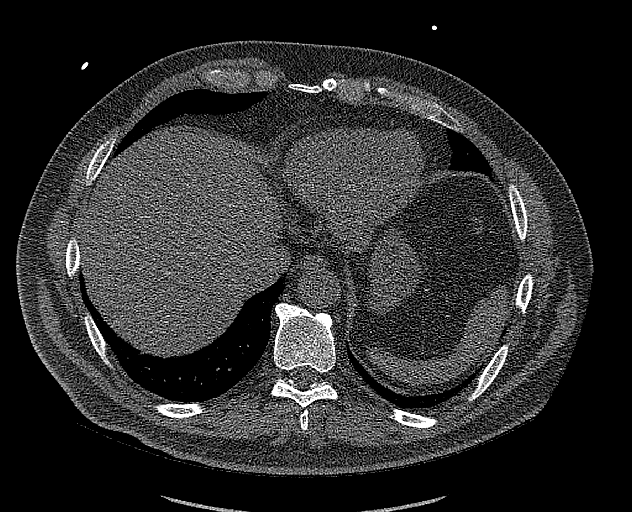
[im 27/80  vessel]
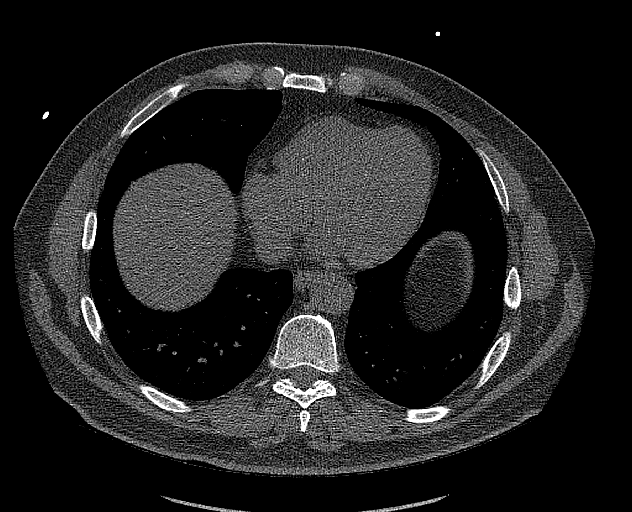
[im 40/80  vessel]
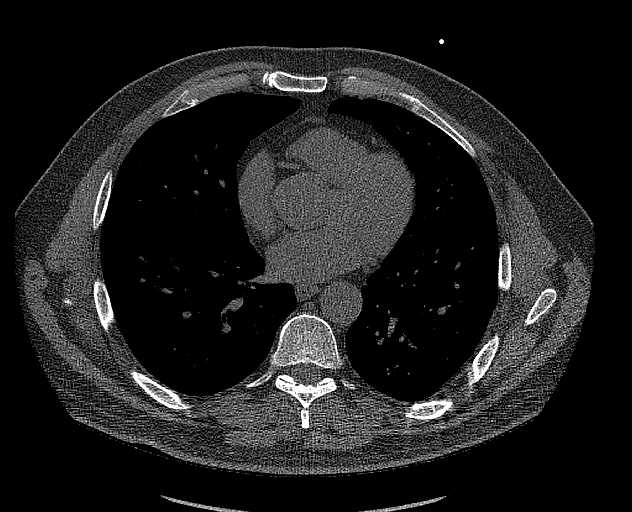
[im 53/80  vessel]
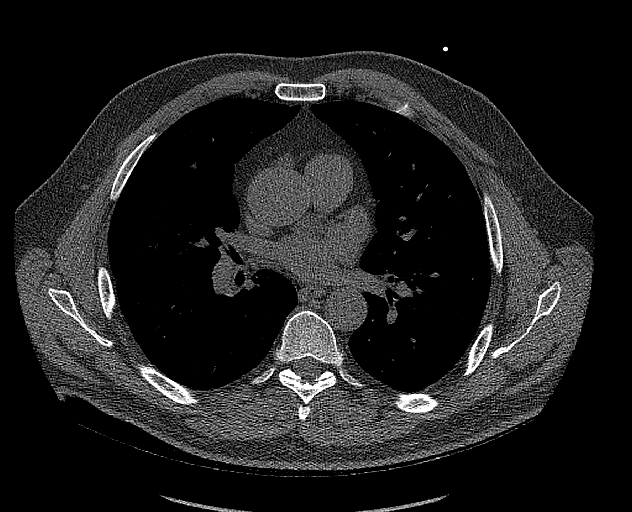
[im 66/80  vessel]
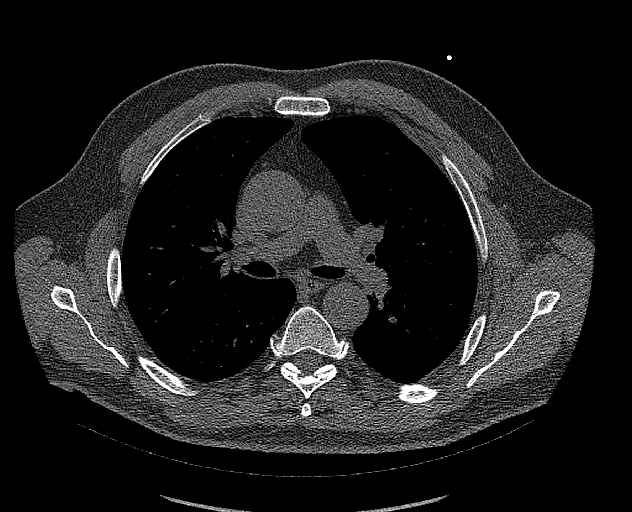

[14 of 20 positions shown; findings below may reference images not displayed]

FINDINGS: CORONARY CALCIUM SCORES:

Left Main: 7

LAD: 97

LCx: 33

RCA: 14

Total Agatston Score: 151

[HOSPITAL] percentile: 76th

AORTA MEASUREMENTS:

Ascending Aorta: 41 mm

Descending Aorta: 29 mm

EXTRACARDIAC FINDINGS:

Within the visualized portions of the thorax there are no suspicious
appearing pulmonary nodules or masses, there is no acute
consolidative airspace disease, no pleural effusions, no
pneumothorax and no lymphadenopathy. Visualized portions of the
upper abdomen are unremarkable. There are no aggressive appearing
lytic or blastic lesions noted in the visualized portions of the
skeleton.
IMPRESSION: 1. Patient's total coronary artery calcium score is 151 which is
76th percentile for patient's of matched age, gender and
race/ethnicity. Please note that although the presence of coronary
artery calcium documents the presence of coronary artery disease,
the severity of this disease and any potential stenosis cannot be
assessed on this noncontrast CT examination. Assessment for
potential risk factor modification, dietary therapy or pharmacologic
therapy may be warranted, if clinically indicated.
2. Aortic atherosclerosis with ectasia of ascending thoracic aorta
(4.1 cm in diameter). Recommend annual imaging followup by CTA or
MRA. This recommendation follows 8252
ACCF/AHA/AATS/ACR/ASA/SCA/CEZAR-ROBERT/STEEN/WOJTEK/PREDSEDA Guidelines for the
Diagnosis and Management of Patients with Thoracic Aortic Disease.
Circulation. 8252; 121: E266-e369. Aortic aneurysm NOS
([id number].5).

## 2023-04-18 DIAGNOSIS — H43813 Vitreous degeneration, bilateral: Secondary | ICD-10-CM | POA: Diagnosis not present

## 2023-04-18 DIAGNOSIS — H53143 Visual discomfort, bilateral: Secondary | ICD-10-CM | POA: Diagnosis not present
